# Patient Record
Sex: Female | Born: 1990 | Race: White | Hispanic: No | Marital: Single | Smoking: Current every day smoker
Health system: Southern US, Community
[De-identification: ages and names within clinical notes are randomized; demographics above are authoritative.]

## PROBLEM LIST (undated history)

## (undated) DIAGNOSIS — F419 Anxiety disorder, unspecified: Secondary | ICD-10-CM

## (undated) DIAGNOSIS — IMO0002 Reserved for concepts with insufficient information to code with codable children: Secondary | ICD-10-CM

## (undated) DIAGNOSIS — F32A Depression, unspecified: Secondary | ICD-10-CM

## (undated) DIAGNOSIS — F329 Major depressive disorder, single episode, unspecified: Secondary | ICD-10-CM

## (undated) DIAGNOSIS — F410 Panic disorder [episodic paroxysmal anxiety] without agoraphobia: Secondary | ICD-10-CM

## (undated) HISTORY — PX: WISDOM TOOTH EXTRACTION: SHX21

## (undated) HISTORY — PX: TONSILLECTOMY: SUR1361

## (undated) HISTORY — PX: KIDNEY SURGERY: SHX687

---
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
## 2009-02-04 ENCOUNTER — Emergency Department (HOSPITAL_COMMUNITY): Admission: EM | Admit: 2009-02-04 | Discharge: 2009-02-04 | Payer: Self-pay | Admitting: Emergency Medicine

## 2009-03-09 IMAGING — NM NM HEPATO W/GB/PHARM/[PERSON_NAME]
1 series · 1 of 1 positions shown · non-contrast
Comparison: Ultrasound abdomen of 10/28/2007

CLINICAL DATA: At right upper quadrant pain, nausea, vomiting

NUCLEAR MEDICINE HEPATOBILIARY IMAGING WITH GALLBLADDER EF
TECHNIQUE: Sequential images of the abdomen were obtained [DATE] minutes following intravenous administration of
radiopharmaceutical. After oral ingestion of 8oz of half and half
cream, gallbladder ejection fraction was determined.
Radiopharmaceutical:  4.7 mCi technetium 99m Choletec, 8 ounces
half and half cream orallymCi Xc-YYm Choletec

[gb hepatobiliary · 1 of 1 slices shown]
[im 1/1]
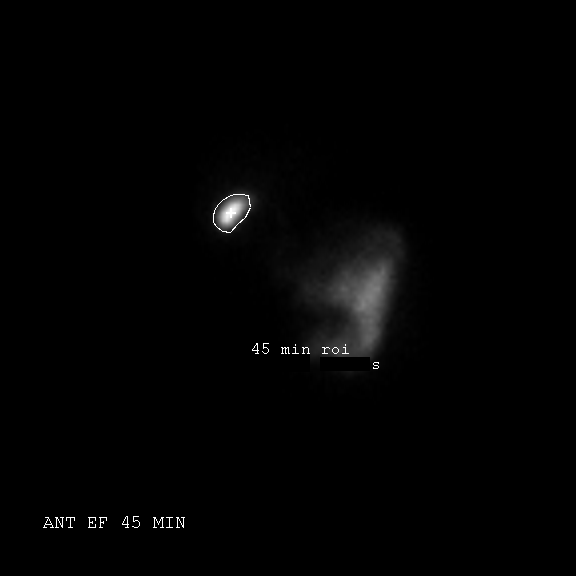

[1 of 1 positions shown; findings below may reference images not displayed]

FINDINGS: The radionuclide appears throughout the liver.  There is
excretion of the radionuclide into the intrahepatic ductal system
with visualization of the common bile duct, gallbladder, and small
bowel.  This represents a normal hepatobiliary scan.

The patient was given 8 ounces of half and half cream orally.  At
45 minutes the gallbladder ejection fraction is measured at 63%,
which is within normal limits.
IMPRESSION: 1.  Normal nuclear medicine hepatobiliary scan.
2.  Normal gallbladder ejection fraction of 63% at 45 minutes.

## 2009-04-03 ENCOUNTER — Encounter: Admission: RE | Admit: 2009-04-03 | Discharge: 2009-04-03 | Payer: Self-pay | Admitting: Family Medicine

## 2010-09-02 ENCOUNTER — Inpatient Hospital Stay (HOSPITAL_COMMUNITY)
Admission: AD | Admit: 2010-09-02 | Discharge: 2010-09-02 | Disposition: A | Discharge status: Home / Self Care | Payer: Medicaid Other | Source: Ambulatory Visit | Attending: Obstetrics and Gynecology | Admitting: Obstetrics and Gynecology

## 2010-09-02 DIAGNOSIS — N949 Unspecified condition associated with female genital organs and menstrual cycle: Secondary | ICD-10-CM | POA: Insufficient documentation

## 2010-09-02 DIAGNOSIS — N938 Other specified abnormal uterine and vaginal bleeding: Secondary | ICD-10-CM | POA: Insufficient documentation

## 2010-09-02 LAB — URINALYSIS, ROUTINE W REFLEX MICROSCOPIC
Bilirubin Urine: NEGATIVE
Glucose, UA: NEGATIVE mg/dL
Ketones, ur: NEGATIVE mg/dL
Leukocytes, UA: NEGATIVE
Nitrite: NEGATIVE
Protein, ur: NEGATIVE mg/dL
Specific Gravity, Urine: 1.015 (ref 1.005–1.030)
Urobilinogen, UA: 0.2 mg/dL (ref 0.0–1.0)
pH: 8.5 — ABNORMAL HIGH (ref 5.0–8.0)

## 2010-09-02 LAB — COMPREHENSIVE METABOLIC PANEL
ALT: 10 U/L (ref 0–35)
AST: 14 U/L (ref 0–37)
Albumin: 4 g/dL (ref 3.5–5.2)
Alkaline Phosphatase: 51 U/L (ref 39–117)
BUN: 7 mg/dL (ref 6–23)
CO2: 22 mEq/L (ref 19–32)
Calcium: 9.2 mg/dL (ref 8.4–10.5)
Chloride: 106 mEq/L (ref 96–112)
Creatinine, Ser: 0.69 mg/dL (ref 0.4–1.2)
GFR calc Af Amer: >60 mL/min (ref >60)
GFR calc non Af Amer: >60 mL/min (ref >60)
Glucose, Bld: 91 mg/dL (ref 70–99)
Potassium: 3.6 mEq/L (ref 3.5–5.1)
Sodium: 136 mEq/L (ref 135–145)
Total Bilirubin: 0.3 mg/dL (ref 0.3–1.2)
Total Protein: 7 g/dL (ref 6.0–8.3)

## 2010-09-02 LAB — POCT PREGNANCY, URINE
Preg Test, Ur: NEGATIVE
Preg Test, Ur: POSITIVE

## 2010-09-02 LAB — CBC
HCT: 37.8 % (ref 36.0–46.0)
Hemoglobin: 12.7 g/dL (ref 12.0–15.0)
MCH: 27.9 pg (ref 26.0–34.0)
MCHC: 33.6 g/dL (ref 30.0–36.0)
MCV: 82.9 fL (ref 78.0–100.0)
Platelets: 225 10*3/uL (ref 150–400)
RBC: 4.56 MIL/uL (ref 3.87–5.11)
RDW: 13.1 % (ref 11.5–15.5)
WBC: 9.6 10*3/uL (ref 4.0–10.5)

## 2010-09-02 LAB — URINE MICROSCOPIC-ADD ON

## 2010-09-17 LAB — URINALYSIS, ROUTINE W REFLEX MICROSCOPIC
Bilirubin Urine: NEGATIVE
Glucose, UA: NEGATIVE mg/dL
Ketones, ur: NEGATIVE mg/dL
Nitrite: NEGATIVE
Protein, ur: NEGATIVE mg/dL
Specific Gravity, Urine: 1.008 (ref 1.005–1.030)
Urobilinogen, UA: 0.2 mg/dL (ref 0.0–1.0)
pH: 6 (ref 5.0–8.0)

## 2010-09-17 LAB — URINE MICROSCOPIC-ADD ON

## 2010-09-17 LAB — PREGNANCY, URINE: Preg Test, Ur: NEGATIVE

## 2010-09-17 LAB — URINE CULTURE: Colony Count: 15000

## 2010-09-17 LAB — RAPID URINE DRUG SCREEN, HOSP PERFORMED
Barbiturates: NOT DETECTED
Cocaine: NOT DETECTED
Opiates: NOT DETECTED

## 2010-10-21 NOTE — Op Note (Signed)
NAMEKINLIE, JANICE                ACCOUNT NO.:  [id number]   MEDICAL RECORD NO.:  [id number]          PATIENT TYPE:  AMB   LOCATION:  SDS                          FACILITY:  [facility]   PHYSICIAN:  Jon Gills, M.D.  DATE OF BIRTH:  April 08, 1991   DATE OF PROCEDURE:  11/25/2007  DATE OF DISCHARGE:  11/25/2007                               OPERATIVE REPORT   PREOPERATIVE DIAGNOSES:  Right upper quadrant pain, nausea, and  vomiting.   POSTOPERATIVE DIAGNOSES:  Right upper quadrant pain, nausea, and  vomiting.   NAME OF OPERATION:  Upper GI endoscopy with biopsy.   SURGEON:  Jon Gills, MD   ASSISTANT:  None.   DESCRIPTION OF FINDINGS:  Following informed written consent, the  patient was taken to the operating room and placed under general  anesthesia with continuous cardiopulmonary monitoring.  She remained in  the supine position and the Pentax upper GI endoscope was passed by  mouth and advanced without difficulty.  A competent lower esophageal  sphincter was present, 37 cm from the incisors.  There was no visual  evidence for esophagitis, gastritis, duodenitis, or peptic ulcer  disease.  A solitary gastric biopsy was negative for Helicobacter by CLO  testing.  Multiple esophageal, gastric, and duodenal biopsies were  histologically normal.  The endoscope was gradually withdrawn and the  patient was awakened and taken to the recovery room in satisfactory  condition.  She will be released later today to the care of her family.   DESCRIPTION OF TECHNICAL PROCEDURE USED:  Pentax upper GI endoscope with  cold biopsy forceps.   DESCRIPTION OF SPECIMENS REMOVED:  Esophagus x3 in formalin, gastric x1  for CLO testing, gastric x3 in formalin, and duodenum x3 in formalin.           ______________________________  Jon Gills, M.D.     JHC/MEDQ  D:  12/09/2007  T:  12/09/2007  Job:  [id number]   cc:   [place] Nation, M.D.

## 2010-10-24 NOTE — Op Note (Signed)
NAMEPASCHA, FOGAL                ACCOUNT NO.:  [id number]   MEDICAL RECORD NO.:  [id number]          PATIENT TYPE:  OIB   LOCATION:  2550                         FACILITY:  [facility]   PHYSICIAN:  Kinnie Scales. Annalee Genta, M.D.DATE OF BIRTH:  24-Mar-1991   DATE OF PROCEDURE:  11/13/2005  DATE OF DISCHARGE:  11/13/2005                                 OPERATIVE REPORT   PREOPERATIVE DIAGNOSIS:  1.  Post tonsillectomy hemorrhage.  2.  Status post tonsillectomy.   POSTOPERATIVE DIAGNOSIS:  1.  Post tonsillectomy hemorrhage.  2.  Status post tonsillectomy.   SURGICAL PROCEDURE:  1.  Examination under anesthesia and control of post tonsillectomy      hemorrhage.  2.  Gastric lavage   ANESTHESIA:  General endotracheal.   SURGEON:  David L. Annalee Genta, M.D.   COMPLICATIONS:  None.   BLOOD LOSS:  Approximately 50 mL.   DISPOSITION:  The patient is transferred from the operating room to the  recovery room in stable condition.   BRIEF HISTORY:  Katelen is a 20 year old white female who underwent  tonsillectomy with Dr. Lucky Cowboy approximately two weeks prior to her  emergency presentation to [facility]. The patient's surgery was  performed for recurrent tonsillitis and chronic sore throats.  The surgery  was uneventful and the postoperative recovery was as expected without  problem or complication until the morning of November 13, 2005, when the patient  awoke with an acute post tonsillectomy hemorrhage.  She reports a coughing  and choking sensation and had a moderate amount of oropharyngeal bleeding.  She presented to the [facility] Emergency Department with active  bleeding and significant clot in the oropharynx.  Airway was stable.  Given  her history and physical findings, I recommended examination under  anesthesia with cautery and control of bleeding.  The risks, benefits, and  possible complications of the procedure were discussed in detail with the  patient's mother  and grandmother and they understood and concurred with our  plan for surgery which was scheduled on an emergency basis at [facility] OR.   PROCEDURE:  The patient was brought to the operating room at [facility] OR from the emergency department.  She underwent general  endotracheal anesthesia with rapid induction anesthesia and a stable airway  without symptoms aspiration or vomiting.  A Crowe-Davis mouth gag was  inserted.  The patient had significant clot in the oropharynx and active  bleeding from the left superior tonsillar pole.  The clots were removed and  the patient's oral cavity and oropharynx were thoroughly irrigated and  suctioned.  There was a single arterial bleeding source in the superior  aspect of the left tonsillar fossa.  This was treated with Bovie suction  cautery with excellent control.  The remainder of the tonsil fossa on the  left as well as the right appeared to be intact and healing well with  minimal eschar. Several small areas of mucosal hemorrhage were also  cauterized with suction cautery.  The patient's bleeding was under good  control.  The  Crowe-Davis mouth gag was released and reapplied again, no  active bleeding.  The oral cavity and oropharynx were suctioned.   Gastric lavage was then undertaken.  An 18-French orogastric [place] sump tube  was placed without difficulty. A moderate amount of clots and active fresh  blood were suctioned from the stomach.  The patient was then lavage with  approximately 300 mL of warm sterile saline. Lavage was continued until all  clots were cleared from the stomach.  The orogastric tube was then removed.  The Crowe-Davis mouth gag was released and removed.  There was no active  bleeding.  The patient was awakened from anesthetic, extubated, and then  transferred from the operating room to the recovery room in stable  condition.  No complications.  50 mL of blood loss.            ______________________________  Kinnie Scales Annalee Genta, M.D.     DLS/MEDQ  D:  16/03/9603  T:  11/13/2005  Job:  [id number]

## 2010-10-24 NOTE — Op Note (Signed)
NAMESENITA, CORREDOR                ACCOUNT NO.:  [id number]   MEDICAL RECORD NO.:  [id number]          PATIENT TYPE:  AMB   LOCATION:  DSC                          FACILITY:  [facility]   PHYSICIAN:  Lucky Cowboy, MD         DATE OF BIRTH:  1990/09/29   DATE OF PROCEDURE:  10/30/2005  DATE OF DISCHARGE:                                 OPERATIVE REPORT   PREOPERATIVE DIAGNOSES:  1.  Chronic tonsillitis.  2.  Adenotonsillar hypertrophy.   POSTOPERATIVE DIAGNOSES:  Dictation ended at this point.   Please delete this record.  Sera Leonie Man, MD  Electronically Signed     SJ/MEDQ  D:  10/30/2005  T:  10/31/2005  Job:  [id number]

## 2010-10-24 NOTE — Op Note (Signed)
NAMEDIKSHA, TAGLIAFERRO NO.:  [id number]   MEDICAL RECORD NO.:  [id number]          PATIENT TYPE:  AMB   LOCATION:  DSC                          FACILITY:  [facility]   PHYSICIAN:  Lucky Cowboy, MD         DATE OF BIRTH:  Jul 26, 1990   DATE OF PROCEDURE:  10/30/2005  DATE OF DISCHARGE:                                 OPERATIVE REPORT   PREOPERATIVE DIAGNOSIS:  Chronic tonsillitis with adenotonsillar  hypertrophy.   POSTOPERATIVE DIAGNOSIS:  Chronic tonsillitis with tonsillar hypertrophy.   PROCEDURE:  Tonsillectomy.   SURGEON:  Lucky Cowboy, M.D.   ANESTHESIA:  General endotracheal anesthesia.   ESTIMATED BLOOD LOSS:  20 mL.   SPECIMENS:  Tonsils.   COMPLICATIONS:  None.   INDICATIONS:  This patient is a 20 year old female who has suffered from  chronic throat pain.  There h has been no improvement despite antireflux  therapy.  She has missed 40 days of school due to the throat pain.  For  these reasons, tonsillectomy was performed.   FINDINGS:  The patient was noted to have 3+ very cryptic bilateral palatine  tonsils.   DESCRIPTION OF PROCEDURE:  The patient was taken to the operating room and  placed on the table in the supine position.  She was then placed under  general endotracheal anesthesia and the table rotated counterclockwise 90  degrees.  The neck was gently extended.  The Crowe-Davis mouth gag with a #4  tongue blade was then placed intraorally, opened and suspended on the Mayo  stand.  Inspection of the nasopharynx revealed no significant adenoid  tissue.  For this reason, adenoidectomy was not performed.  The right  palatine tonsil was grasped with Allis clamps and directed inferior  medially.  The Harmonic scalpel was then used to excise the tonsil staying  within the peritonsillar space.  The left palatine tonsil was removed in an  identical fashion.  Suction cautery used for hemostasis.  The oral cavity  was irrigated and suctioned out and an NG  tube placed down the esophagus for  suctioning of the gastric contents.  The mouth gag was removed noting no  damage to the teeth or soft tissues.  The table was rotated clockwise 90  degrees to its original position.  The patient was awakened from anesthesia  and taken to the Post Anesthesia Care Unit in stable condition.  There were  no complications.      Lucky Cowboy, MD  Electronically Signed     SJ/MEDQ  D:  10/30/2005  T:  10/31/2005  Job:  [id number]   cc:   [facility]   [place] Nation, M.D.  Fax: [phone]   Jessica Priest, M.D.  Fax: [phone]

## 2011-02-25 ENCOUNTER — Other Ambulatory Visit: Payer: Self-pay | Admitting: Orthopedic Surgery

## 2011-02-25 DIAGNOSIS — M545 Low back pain: Secondary | ICD-10-CM

## 2011-03-02 ENCOUNTER — Ambulatory Visit
Admission: RE | Admit: 2011-03-02 | Discharge: 2011-03-02 | Disposition: A | Discharge status: Home / Self Care | Payer: Medicaid Other | Source: Ambulatory Visit | Attending: Orthopedic Surgery | Admitting: Orthopedic Surgery

## 2011-03-02 DIAGNOSIS — M545 Low back pain: Secondary | ICD-10-CM

## 2011-03-02 LAB — URINALYSIS, ROUTINE W REFLEX MICROSCOPIC
Bilirubin Urine: NEGATIVE
Glucose, UA: NEGATIVE
Ketones, ur: NEGATIVE
Specific Gravity, Urine: 1.024
pH: 6

## 2011-03-02 LAB — DIFFERENTIAL
Basophils Relative: 1
Eosinophils Relative: 2
Monocytes Absolute: 0.6
Monocytes Relative: 7
Neutro Abs: 5

## 2011-03-02 LAB — POCT PREGNANCY, URINE
Operator id: 277751
Preg Test, Ur: NEGATIVE

## 2011-03-02 LAB — COMPREHENSIVE METABOLIC PANEL
AST: 16
Albumin: 3.4 — ABNORMAL LOW
Alkaline Phosphatase: 63
BUN: 6
Potassium: 3.7
Sodium: 141
Total Protein: 6.3

## 2011-03-02 LAB — CBC
HCT: 35.1 — ABNORMAL LOW
Platelets: 241
RDW: 13.1
WBC: 9.5

## 2011-03-02 LAB — URINE MICROSCOPIC-ADD ON

## 2011-03-05 LAB — POCT URINALYSIS DIP (DEVICE)
Glucose, UA: NEGATIVE
Ketones, ur: NEGATIVE
Operator id: 247071
Specific Gravity, Urine: 1.025

## 2011-03-05 LAB — CBC
HCT: 36.7
Hemoglobin: 12.8
WBC: 7.9

## 2011-03-05 LAB — AMYLASE: Amylase: 61

## 2011-03-06 LAB — URINALYSIS, ROUTINE W REFLEX MICROSCOPIC
Glucose, UA: NEGATIVE
Specific Gravity, Urine: 1.034 — ABNORMAL HIGH
pH: 6.5

## 2011-03-06 LAB — URINE MICROSCOPIC-ADD ON

## 2011-03-26 ENCOUNTER — Ambulatory Visit: Payer: Medicaid Other | Admitting: Rehabilitation

## 2011-04-09 ENCOUNTER — Ambulatory Visit: Payer: Medicaid Other | Admitting: Physical Therapy

## 2011-04-17 ENCOUNTER — Emergency Department (HOSPITAL_COMMUNITY)
Admission: EM | Admit: 2011-04-17 | Discharge: 2011-04-17 | Discharge status: Against Medical Advice | Payer: Medicaid Other

## 2012-01-15 ENCOUNTER — Encounter (HOSPITAL_COMMUNITY): Payer: Self-pay

## 2012-01-15 ENCOUNTER — Emergency Department (HOSPITAL_COMMUNITY)
Admission: EM | Admit: 2012-01-15 | Discharge: 2012-01-15 | Disposition: A | Discharge status: Home / Self Care | Payer: No Typology Code available for payment source | Attending: Emergency Medicine | Admitting: Emergency Medicine

## 2012-01-15 ENCOUNTER — Emergency Department (HOSPITAL_COMMUNITY): Payer: No Typology Code available for payment source

## 2012-01-15 DIAGNOSIS — M25519 Pain in unspecified shoulder: Secondary | ICD-10-CM | POA: Insufficient documentation

## 2012-01-15 DIAGNOSIS — Y92410 Unspecified street and highway as the place of occurrence of the external cause: Secondary | ICD-10-CM | POA: Insufficient documentation

## 2012-01-15 DIAGNOSIS — F341 Dysthymic disorder: Secondary | ICD-10-CM | POA: Insufficient documentation

## 2012-01-15 DIAGNOSIS — F17200 Nicotine dependence, unspecified, uncomplicated: Secondary | ICD-10-CM | POA: Insufficient documentation

## 2012-01-15 DIAGNOSIS — M542 Cervicalgia: Secondary | ICD-10-CM | POA: Insufficient documentation

## 2012-01-15 DIAGNOSIS — M25539 Pain in unspecified wrist: Secondary | ICD-10-CM | POA: Insufficient documentation

## 2012-01-15 DIAGNOSIS — M25531 Pain in right wrist: Secondary | ICD-10-CM

## 2012-01-15 HISTORY — DX: Reserved for concepts with insufficient information to code with codable children: IMO0002

## 2012-01-15 HISTORY — DX: Depression, unspecified: F32.A

## 2012-01-15 HISTORY — DX: Major depressive disorder, single episode, unspecified: F32.9

## 2012-01-15 HISTORY — DX: Panic disorder (episodic paroxysmal anxiety): F41.0

## 2012-01-15 HISTORY — DX: Anxiety disorder, unspecified: F41.9

## 2012-01-15 MED ORDER — OXYCODONE-ACETAMINOPHEN 5-325 MG PO TABS
1.0000 | ORAL_TABLET | Freq: Once | ORAL | Status: AC
  Administered 2012-01-15: 1 via ORAL
  Filled 2012-01-15: qty 1

## 2012-01-15 MED ORDER — IBUPROFEN 800 MG PO TABS: 800.0000 mg | ORAL_TABLET | Freq: Three times a day (TID) | ORAL | Status: AC

## 2012-01-15 NOTE — ED Notes (Signed)
C-collar was discontinued as instructed by C. Williams PAC.

## 2012-01-15 NOTE — Progress Notes (Signed)
Orthopedic Tech Progress Note Patient Details:  Wendy Reyes [birth year] [id number]  Ortho Devices Type of Ortho Device: Velcro wrist splint Ortho Device/Splint Location: right wrist  Ortho Device/Splint Interventions: Application   Wendy Reyes 01/15/2012, 6:03 PM
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
## 2012-01-15 NOTE — ED Provider Notes (Signed)
Medical screening examination/treatment/procedure(s) were performed by non-physician practitioner and as supervising physician I was immediately available for consultation/collaboration.   Richardean Canal, MD 01/15/12 [phone]

## 2012-01-15 NOTE — ED Notes (Signed)
Georgie Chard PAC is at bedside.

## 2012-01-15 NOTE — ED Notes (Signed)
Backboard removed.

## 2012-01-22 ENCOUNTER — Other Ambulatory Visit (HOSPITAL_COMMUNITY): Payer: Medicaid Other

## 2012-01-22 ENCOUNTER — Other Ambulatory Visit (HOSPITAL_COMMUNITY): Payer: Self-pay | Admitting: Chiropractic Medicine

## 2012-01-22 ENCOUNTER — Ambulatory Visit (HOSPITAL_COMMUNITY)
Admission: RE | Admit: 2012-01-22 | Discharge: 2012-01-22 | Disposition: A | Discharge status: Home / Self Care | Payer: No Typology Code available for payment source | Source: Ambulatory Visit | Attending: Chiropractic Medicine | Admitting: Chiropractic Medicine

## 2012-01-22 DIAGNOSIS — R51 Headache: Secondary | ICD-10-CM

## 2012-01-22 DIAGNOSIS — M545 Low back pain, unspecified: Secondary | ICD-10-CM | POA: Insufficient documentation

## 2012-02-04 ENCOUNTER — Other Ambulatory Visit (HOSPITAL_COMMUNITY): Payer: Self-pay | Admitting: Chiropractic Medicine

## 2012-02-04 DIAGNOSIS — R52 Pain, unspecified: Secondary | ICD-10-CM

## 2012-02-06 ENCOUNTER — Ambulatory Visit (HOSPITAL_COMMUNITY)
Admission: RE | Admit: 2012-02-06 | Discharge: 2012-02-06 | Disposition: A | Discharge status: Home / Self Care | Payer: No Typology Code available for payment source | Source: Ambulatory Visit | Attending: Chiropractic Medicine | Admitting: Chiropractic Medicine

## 2012-02-06 DIAGNOSIS — R52 Pain, unspecified: Secondary | ICD-10-CM

## 2012-02-06 DIAGNOSIS — M25439 Effusion, unspecified wrist: Secondary | ICD-10-CM | POA: Insufficient documentation

## 2012-02-06 DIAGNOSIS — M25539 Pain in unspecified wrist: Secondary | ICD-10-CM | POA: Insufficient documentation

## 2012-02-06 DIAGNOSIS — S60219A Contusion of unspecified wrist, initial encounter: Secondary | ICD-10-CM | POA: Insufficient documentation

## 2012-10-16 ENCOUNTER — Encounter (HOSPITAL_COMMUNITY): Payer: Self-pay | Admitting: *Deleted

## 2012-10-16 ENCOUNTER — Emergency Department (HOSPITAL_COMMUNITY)
Admission: EM | Admit: 2012-10-16 | Discharge: 2012-10-16 | Disposition: A | Discharge status: Home / Self Care | Payer: Self-pay | Attending: Emergency Medicine | Admitting: Emergency Medicine

## 2012-10-16 DIAGNOSIS — F329 Major depressive disorder, single episode, unspecified: Secondary | ICD-10-CM | POA: Insufficient documentation

## 2012-10-16 DIAGNOSIS — Z8739 Personal history of other diseases of the musculoskeletal system and connective tissue: Secondary | ICD-10-CM | POA: Insufficient documentation

## 2012-10-16 DIAGNOSIS — F411 Generalized anxiety disorder: Secondary | ICD-10-CM | POA: Insufficient documentation

## 2012-10-16 DIAGNOSIS — N390 Urinary tract infection, site not specified: Secondary | ICD-10-CM | POA: Insufficient documentation

## 2012-10-16 DIAGNOSIS — Z9889 Other specified postprocedural states: Secondary | ICD-10-CM | POA: Insufficient documentation

## 2012-10-16 DIAGNOSIS — R109 Unspecified abdominal pain: Secondary | ICD-10-CM | POA: Insufficient documentation

## 2012-10-16 DIAGNOSIS — F3289 Other specified depressive episodes: Secondary | ICD-10-CM | POA: Insufficient documentation

## 2012-10-16 DIAGNOSIS — Z79899 Other long term (current) drug therapy: Secondary | ICD-10-CM | POA: Insufficient documentation

## 2012-10-16 DIAGNOSIS — F17200 Nicotine dependence, unspecified, uncomplicated: Secondary | ICD-10-CM | POA: Insufficient documentation

## 2012-10-16 DIAGNOSIS — Z3202 Encounter for pregnancy test, result negative: Secondary | ICD-10-CM | POA: Insufficient documentation

## 2012-10-16 DIAGNOSIS — R51 Headache: Secondary | ICD-10-CM | POA: Insufficient documentation

## 2012-10-16 LAB — URINALYSIS, ROUTINE W REFLEX MICROSCOPIC
Glucose, UA: NEGATIVE mg/dL
Hgb urine dipstick: NEGATIVE
Ketones, ur: 40 mg/dL — AB
Nitrite: POSITIVE — AB
Protein, ur: 30 mg/dL — AB
Specific Gravity, Urine: 1.027 (ref 1.005–1.030)
Urobilinogen, UA: 2 mg/dL — ABNORMAL HIGH (ref 0.0–1.0)
pH: 7.5 (ref 5.0–8.0)

## 2012-10-16 LAB — URINE MICROSCOPIC-ADD ON

## 2012-10-16 LAB — POCT PREGNANCY, URINE: Preg Test, Ur: NEGATIVE

## 2012-10-16 MED ORDER — KETOROLAC TROMETHAMINE 15 MG/ML IJ SOLN
15.0000 mg | Freq: Once | INTRAMUSCULAR | Status: AC
  Filled 2012-10-16: qty 1

## 2012-10-16 MED ORDER — KETOROLAC TROMETHAMINE 30 MG/ML IJ SOLN
INTRAMUSCULAR | Status: AC
  Administered 2012-10-16: 15 mg
  Filled 2012-10-16: qty 1

## 2012-10-16 MED ORDER — SODIUM CHLORIDE 0.9 % IV BOLUS (SEPSIS)
1000.0000 mL | Freq: Once | INTRAVENOUS | Status: AC
  Administered 2012-10-16: 1000 mL via INTRAVENOUS

## 2012-10-16 MED ORDER — CIPROFLOXACIN IN D5W 400 MG/200ML IV SOLN
400.0000 mg | Freq: Once | INTRAVENOUS | Status: AC
  Administered 2012-10-16: 400 mg via INTRAVENOUS
  Filled 2012-10-16: qty 200

## 2012-10-16 MED ORDER — CIPROFLOXACIN HCL 500 MG PO TABS: 500.0000 mg | ORAL_TABLET | Freq: Two times a day (BID) | ORAL | Status: DC

## 2012-10-16 NOTE — ED Notes (Signed)
Pt is here with painful urination, frequency with urination, and pt has some lower abdominal pain.  Pt reports vaginal pain and back pain.  Pt concerned about a sinus infection too.

## 2012-10-18 LAB — URINE CULTURE

## 2012-10-20 NOTE — ED Provider Notes (Signed)
History    22 year old female with dysuria. Onset about 3 days ago. Increased urinary frequency. Some crampy lower abdominal pain. Suprapubic. No radiation. No fevers or chills. No nausea or vomiting. Patient is M. concerned about possibly having a sinus infection. She endorses increased facial pressure and a mild frontal headache over the past day or 2. No intervention prior to arrival.  CSN: [id number]  Arrival date & time 10/16/12  1437   First MD Initiated Contact with Patient 10/16/12 1637      No chief complaint on file.   (Consider location/radiation/quality/duration/timing/severity/associated sxs/prior treatment) HPI  Past Medical History  Diagnosis Date  . DDD (degenerative disc disease)   . Anxiety   . Panic attacks   . Depression     Past Surgical History  Procedure Laterality Date  . Tonsillectomy    . Kidney surgery    . Wisdom tooth extraction      No family history on file.  History  Substance Use Topics  . Smoking status: Current Every Day Smoker -- 1.00 packs/day  . Smokeless tobacco: Not on file  . Alcohol Use: No    OB History   Grav Para Term Preterm Abortions TAB SAB Ect Mult Living                  Review of Systems  All systems reviewed and negative, other than as noted in HPI.   Allergies  Sulfa antibiotics and Amoxicillin  Home Medications   Current Outpatient Rx  Name  Route  Sig  Dispense  Refill  . diazepam (VALIUM) 10 MG tablet   Oral   Take 10 mg by mouth 2 (two) times daily.         Marland Kitchen doxepin (SINEQUAN) 50 MG capsule   Oral   Take 100 mg by mouth at bedtime.         . lamoTRIgine (LAMICTAL) 100 MG tablet   Oral   Take 100 mg by mouth every morning.         . Pseudoephedrine-APAP-DM (TYLENOL COLD/FLU SEVERE DAY PO)   Oral   Take 10 mLs by mouth 2 (two) times daily as needed (for cold/flu symptoms).          . ciprofloxacin (CIPRO) 500 MG tablet   Oral   Take 1 tablet (500 mg total) by mouth 2 (two) times  daily.   8 tablet   0     BP 109/67  Pulse 58  Temp(Src) 98.7 F (37.1 C) (Oral)  Resp 18  SpO2 99%  LMP 10/03/2012  Physical Exam  Nursing note and vitals reviewed. Constitutional: She appears well-developed and well-nourished. No distress.  HENT:  Head: Normocephalic and atraumatic.  Eyes: Conjunctivae are normal. Right eye exhibits no discharge. Left eye exhibits no discharge.  Neck: Neck supple.  Cardiovascular: Normal rate, regular rhythm and normal heart sounds.  Exam reveals no gallop and no friction rub.   No murmur heard. Pulmonary/Chest: Effort normal and breath sounds normal. No respiratory distress.  Abdominal: Soft. She exhibits no distension. There is no tenderness.  Mild suprapubic tenderness without rebound or guarding. No distention.  Genitourinary:  No CVA tenderness  Musculoskeletal: She exhibits no edema and no tenderness.  Neurological: She is alert.  Skin: Skin is warm and dry.  Psychiatric: She has a normal mood and affect. Her behavior is normal. Thought content normal.    ED Course  Procedures (including critical care time)  Labs Reviewed  URINALYSIS, ROUTINE W REFLEX  MICROSCOPIC - Abnormal; Notable for the following:    APPearance CLOUDY (*)    Bilirubin Urine SMALL (*)    Ketones, ur 40 (*)    Protein, ur 30 (*)    Urobilinogen, UA 2.0 (*)    Nitrite POSITIVE (*)    Leukocytes, UA MODERATE (*)    All other components within normal limits  URINE MICROSCOPIC-ADD ON - Abnormal; Notable for the following:    Squamous Epithelial / LPF FEW (*)    Bacteria, UA FEW (*)    All other components within normal limits  URINE CULTURE  POCT PREGNANCY, URINE   No results found.   1. UTI (urinary tract infection)       MDM  21yF with symptoms of cystitis and UA consistent with as well. Afebrile and well appearing. outpt tx. Return precautions discussed.         Raeford Razor, MD 10/20/12 [phone]

## 2012-10-22 ENCOUNTER — Telehealth (HOSPITAL_COMMUNITY): Payer: Self-pay | Admitting: Emergency Medicine

## 2012-10-22 NOTE — ED Notes (Signed)
Post ED Visit - Positive Culture Follow-up  Culture report reviewed by antimicrobial stewardship pharmacist: []  Wes Dulaney, Pharm.D., BCPS [x]  Celedonio Miyamoto, Pharm.D., BCPS []  Georgina Pillion, Pharm.D., BCPS []  [place], [address].D., BCPS, AAHIVP []  Estella Husk, Pharm.D., BCPS, AAHIV  Positive urine culture Treated with Cipro, organism sensitive to the same and no further patient follow-up is required at this time.  Kylie A Holland 10/22/2012, 8:41 AM

## 2012-10-26 ENCOUNTER — Encounter (HOSPITAL_COMMUNITY): Payer: Self-pay | Admitting: Adult Health

## 2012-10-26 DIAGNOSIS — Z87440 Personal history of urinary (tract) infections: Secondary | ICD-10-CM | POA: Insufficient documentation

## 2012-10-26 DIAGNOSIS — Z79899 Other long term (current) drug therapy: Secondary | ICD-10-CM | POA: Insufficient documentation

## 2012-10-26 DIAGNOSIS — F17200 Nicotine dependence, unspecified, uncomplicated: Secondary | ICD-10-CM | POA: Insufficient documentation

## 2012-10-26 DIAGNOSIS — F411 Generalized anxiety disorder: Secondary | ICD-10-CM | POA: Insufficient documentation

## 2012-10-26 DIAGNOSIS — F329 Major depressive disorder, single episode, unspecified: Secondary | ICD-10-CM | POA: Insufficient documentation

## 2012-10-26 DIAGNOSIS — N898 Other specified noninflammatory disorders of vagina: Secondary | ICD-10-CM | POA: Insufficient documentation

## 2012-10-26 DIAGNOSIS — Z8739 Personal history of other diseases of the musculoskeletal system and connective tissue: Secondary | ICD-10-CM | POA: Insufficient documentation

## 2012-10-26 DIAGNOSIS — Z3202 Encounter for pregnancy test, result negative: Secondary | ICD-10-CM | POA: Insufficient documentation

## 2012-10-26 DIAGNOSIS — R1031 Right lower quadrant pain: Secondary | ICD-10-CM | POA: Insufficient documentation

## 2012-10-26 DIAGNOSIS — F3289 Other specified depressive episodes: Secondary | ICD-10-CM | POA: Insufficient documentation

## 2012-10-26 LAB — URINALYSIS, ROUTINE W REFLEX MICROSCOPIC
Hgb urine dipstick: NEGATIVE
Leukocytes, UA: NEGATIVE
Specific Gravity, Urine: 1.035 — ABNORMAL HIGH (ref 1.005–1.030)
Urobilinogen, UA: 1 mg/dL (ref 0.0–1.0)

## 2012-10-26 LAB — POCT PREGNANCY, URINE: Preg Test, Ur: NEGATIVE

## 2012-10-26 NOTE — ED Notes (Signed)
Presents with lower abdominal/vaginal pain described as "intense pain" Treated on mothers day for a kidney infection and given cipro. The pain has gotten worse and pt is unable to stand for long periods due to pain. Reports pain with urination.

## 2012-10-27 ENCOUNTER — Emergency Department (HOSPITAL_COMMUNITY)
Admission: EM | Admit: 2012-10-27 | Discharge: 2012-10-27 | Disposition: A | Discharge status: Home / Self Care | Payer: Self-pay | Attending: Emergency Medicine | Admitting: Emergency Medicine

## 2012-10-27 ENCOUNTER — Emergency Department (HOSPITAL_COMMUNITY): Payer: Self-pay

## 2012-10-27 DIAGNOSIS — R109 Unspecified abdominal pain: Secondary | ICD-10-CM

## 2012-10-27 LAB — CBC WITH DIFFERENTIAL/PLATELET
Eosinophils Relative: 1 % (ref 0–5)
HCT: 37.7 % (ref 36.0–46.0)
Lymphocytes Relative: 30 % (ref 12–46)
Lymphs Abs: 2.8 10*3/uL (ref 0.7–4.0)
MCV: 82.5 fL (ref 78.0–100.0)
Monocytes Absolute: 0.6 10*3/uL (ref 0.1–1.0)
Neutro Abs: 5.8 10*3/uL (ref 1.7–7.7)
RBC: 4.57 MIL/uL (ref 3.87–5.11)
WBC: 9.4 10*3/uL (ref 4.0–10.5)

## 2012-10-27 MED ORDER — HYDROCODONE-ACETAMINOPHEN 5-325 MG PO TABS: 2.0000 | ORAL_TABLET | ORAL | Status: DC | PRN

## 2012-10-27 NOTE — ED Notes (Signed)
Pelvic cart setup for exam 

## 2012-10-27 NOTE — ED Provider Notes (Signed)
History     CSN: [id number]  Arrival date & time 10/26/12  2057   First MD Initiated Contact with Patient 10/27/12 0123      Chief Complaint  Patient presents with  . Abdominal Pain    (Consider location/radiation/quality/duration/timing/severity/associated sxs/prior treatment) HPI Comments: Patient presents with right lower abdominal pain for the past three weeks.  She was found to have a uti and treated with antibiotics but is continuing to have the same pain.  No fevers or chills.  No vaginal discharge.  Her last period was last month and normal.  Denies possibility of pregnancy.    Patient is a 22 y.o. female presenting with abdominal pain. The history is provided by the patient.  Abdominal Pain This is a new problem. Episode onset: 3 weeks ago. The problem occurs constantly. The problem has been gradually worsening. Associated symptoms include abdominal pain. Nothing aggravates the symptoms. Nothing relieves the symptoms. She has tried nothing for the symptoms. The treatment provided no relief.    Past Medical History  Diagnosis Date  . DDD (degenerative disc disease)   . Anxiety   . Panic attacks   . Depression     Past Surgical History  Procedure Laterality Date  . Tonsillectomy    . Kidney surgery    . Wisdom tooth extraction      History reviewed. No pertinent family history.  History  Substance Use Topics  . Smoking status: Current Every Day Smoker -- 1.00 packs/day  . Smokeless tobacco: Not on file  . Alcohol Use: No    OB History   Grav Para Term Preterm Abortions TAB SAB Ect Mult Living                  Review of Systems  Gastrointestinal: Positive for abdominal pain.  All other systems reviewed and are negative.    Allergies  Sulfa antibiotics and Amoxicillin  Home Medications   Current Outpatient Rx  Name  Route  Sig  Dispense  Refill  . diazepam (VALIUM) 10 MG tablet   Oral   Take 10 mg by mouth every 12 (twelve) hours as needed for  anxiety.          Marland Kitchen doxepin (SINEQUAN) 50 MG capsule   Oral   Take 100 mg by mouth at bedtime.         . lamoTRIgine (LAMICTAL) 100 MG tablet   Oral   Take 100 mg by mouth every morning.           BP 112/78  Pulse 88  Temp(Src) 98.6 F (37 C) (Oral)  Resp 18  SpO2 99%  LMP 10/03/2012  Physical Exam  Nursing note and vitals reviewed. Constitutional: She is oriented to person, place, and time. She appears well-developed and well-nourished. No distress.  HENT:  Head: Normocephalic and atraumatic.  Neck: Normal range of motion. Neck supple.  Cardiovascular: Normal rate and regular rhythm.  Exam reveals no gallop and no friction rub.   No murmur heard. Pulmonary/Chest: Effort normal and breath sounds normal. No respiratory distress. She has no wheezes.  Abdominal: Soft. Bowel sounds are normal. She exhibits no distension. There is tenderness.  There is ttp in the suprapubic region and right lower quadrant.    Genitourinary: Uterus normal. Vaginal discharge found.  There is a slight whitish discharge present.  There is ttp in the right adnexa, but I am unable to palpate a mass.  No cmt.  Musculoskeletal: Normal range of motion.  Neurological:  She is alert and oriented to person, place, and time.  Skin: Skin is warm and dry. She is not diaphoretic.    ED Course  Procedures (including critical care time)  Labs Reviewed  URINALYSIS, ROUTINE W REFLEX MICROSCOPIC - Abnormal; Notable for the following:    APPearance HAZY (*)    Specific Gravity, Urine 1.035 (*)    Bilirubin Urine SMALL (*)    All other components within normal limits  WET PREP, GENITAL  GC/CHLAMYDIA PROBE AMP  CBC WITH DIFFERENTIAL  POCT PREGNANCY, URINE   No results found.   No diagnosis found.    MDM  The workup does not suggest uti, ovarian cyst, ectopic pregnancy, appendicitis, vaginal infection, or other emergent cause.  She will be discharged with pain meds, to discuss further workup with  pcp if not improving in the next few days.          Geoffery Lyons, MD 10/27/12 [phone]

## 2012-10-27 NOTE — ED Notes (Signed)
Lab in room to obtain blood work

## 2012-10-27 NOTE — ED Notes (Signed)
Assisted Dr Wendy Reyes in pelvic exam. Patient tolerated well.

## 2012-10-27 NOTE — ED Notes (Signed)
Patient given discharge instructions for abdominal pain. rx for norco. Advised to follow up with primary care or to return to this department if condition worsens. Patient voiced understanding of all instructions and had no further questions. Patient ambulated to front lobby without difficulty.

## 2012-10-28 LAB — GC/CHLAMYDIA PROBE AMP: GC Probe RNA: NEGATIVE
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
## 2013-08-08 ENCOUNTER — Encounter (HOSPITAL_COMMUNITY): Payer: Self-pay | Admitting: Emergency Medicine

## 2013-08-08 ENCOUNTER — Emergency Department (HOSPITAL_COMMUNITY)
Admission: EM | Admit: 2013-08-08 | Discharge: 2013-08-08 | Disposition: A | Discharge status: Home / Self Care | Payer: No Typology Code available for payment source | Attending: Emergency Medicine | Admitting: Emergency Medicine

## 2013-08-08 DIAGNOSIS — F17200 Nicotine dependence, unspecified, uncomplicated: Secondary | ICD-10-CM | POA: Insufficient documentation

## 2013-08-08 DIAGNOSIS — K089 Disorder of teeth and supporting structures, unspecified: Secondary | ICD-10-CM | POA: Insufficient documentation

## 2013-08-08 DIAGNOSIS — Z8659 Personal history of other mental and behavioral disorders: Secondary | ICD-10-CM | POA: Insufficient documentation

## 2013-08-08 DIAGNOSIS — Z8739 Personal history of other diseases of the musculoskeletal system and connective tissue: Secondary | ICD-10-CM | POA: Insufficient documentation

## 2013-08-08 DIAGNOSIS — K0889 Other specified disorders of teeth and supporting structures: Secondary | ICD-10-CM

## 2013-08-08 MED ORDER — HYDROCODONE-ACETAMINOPHEN 5-325 MG PO TABS: 1.0000 | ORAL_TABLET | ORAL | Status: DC | PRN

## 2013-08-08 MED ORDER — HYDROCODONE-ACETAMINOPHEN 5-325 MG PO TABS
1.0000 | ORAL_TABLET | Freq: Once | ORAL | Status: AC
  Administered 2013-08-08: 1 via ORAL
  Filled 2013-08-08: qty 1

## 2013-08-08 MED ORDER — CLINDAMYCIN HCL 300 MG PO CAPS
300.0000 mg | ORAL_CAPSULE | Freq: Once | ORAL | Status: AC
  Administered 2013-08-08: 300 mg via ORAL
  Filled 2013-08-08: qty 1

## 2013-08-08 MED ORDER — CLINDAMYCIN HCL 150 MG PO CAPS: 300.0000 mg | ORAL_CAPSULE | Freq: Three times a day (TID) | ORAL | Status: AC

## 2013-08-08 NOTE — Progress Notes (Signed)
[facility] provided pt with a list of primary care resources to help patient establish primary care, as well as, dental resources.  °

## 2013-08-08 NOTE — ED Provider Notes (Signed)
CSN: [id number]     Arrival date & time 08/08/13  1508 History   None    Chief Complaint  Patient presents with  . Dental Pain     (Consider location/radiation/quality/duration/timing/severity/associated sxs/prior Treatment) Patient is a 23 y.o. female presenting with tooth pain. The history is provided by the patient. No language interpreter was used.  Dental Pain Location:  Lower Lower teeth location:  18/LL 2nd molar Quality:  Shooting and throbbing Associated symptoms: no facial swelling and no fever   Associated symptoms comment:  Lower left molar that fractured over one month ago now causing significant pain without facial swelling or fever.    Past Medical History  Diagnosis Date  . DDD (degenerative disc disease)   . Anxiety   . Panic attacks   . Depression    Past Surgical History  Procedure Laterality Date  . Tonsillectomy    . Kidney surgery    . Wisdom tooth extraction     History reviewed. No pertinent family history. History  Substance Use Topics  . Smoking status: Current Every Day Smoker -- 1.00 packs/day  . Smokeless tobacco: Not on file  . Alcohol Use: No   OB History   Grav Para Term Preterm Abortions TAB SAB Ect Mult Living                 Review of Systems  Constitutional: Negative for fever and chills.  HENT: Positive for dental problem. Negative for facial swelling and trouble swallowing.   Respiratory: Negative.   Cardiovascular: Negative.   Gastrointestinal: Negative.   Musculoskeletal: Negative.   Skin: Negative.   Neurological: Negative.       Allergies  Sulfa antibiotics and Amoxicillin  Home Medications  No current outpatient prescriptions on file. BP 119/72  Pulse 88  Temp(Src) 98.6 F (37 C) (Oral)  Resp 16  SpO2 100% Physical Exam  Constitutional: She is oriented to person, place, and time. She appears well-developed and well-nourished.  HENT:  Mouth/Throat: Oropharynx is clear and moist.  Generally good dentition.  #18 with missing fragment laterally with exposed filling. No surrounding swelling.   Neck: Normal range of motion.  Pulmonary/Chest: Effort normal.  Lymphadenopathy:       Head (left side): No submental adenopathy present.  Neurological: She is alert and oriented to person, place, and time.  Skin: Skin is warm and dry.    ED Course  Procedures (including critical care time) Labs Review Labs Reviewed - No data to display Imaging Review No results found.   EKG Interpretation None      MDM   Final diagnoses:  None    1. Dental pain  No evidence of drainable abscess. Will cover with abx and encourage dental follow up.    Arnoldo HookerShari A Gyneth Hubka, PA-C 08/08/13 1642

## 2013-08-08 NOTE — Discharge Instructions (Signed)
Dental Care and Dentist Visits °Dental care supports good overall health. Regular dental visits can also help you avoid dental pain, bleeding, infection, and other more serious health problems in the future. It is important to keep the mouth healthy because diseases in the teeth, gums, and other oral tissues can spread to other areas of the body. Some problems, such as diabetes, heart disease, and pre-term labor have been associated with poor oral health.  °See your dentist every 6 months. If you experience emergency problems such as a toothache or broken tooth, go to the dentist right away. If you see your dentist regularly, you may catch problems early. It is easier to be treated for problems in the early stages.  °WHAT TO EXPECT AT A DENTIST VISIT  °Your dentist will look for many common oral health problems and recommend proper treatment. At your regular dental visit, you can expect: °· Gentle cleaning of the teeth and gums. This includes scraping and polishing. This helps to remove the sticky substance around the teeth and gums (plaque). Plaque forms in the mouth shortly after eating. Over time, plaque hardens on the teeth as tartar. If tartar is not removed regularly, it can cause problems. Cleaning also helps remove stains. °· Periodic X-rays. These pictures of the teeth and supporting bone will help your dentist assess the health of your teeth. °· Periodic fluoride treatments. Fluoride is a natural mineral shown to help strengthen teeth. Fluoride treatment involves applying a fluoride gel or varnish to the teeth. It is most commonly done in children. °· Examination of the mouth, tongue, jaws, teeth, and gums to look for any oral health problems, such as: °· Cavities (dental caries). This is decay on the tooth caused by plaque, sugar, and acid in the mouth. It is best to catch a cavity when it is small. °· Inflammation of the gums caused by plaque buildup (gingivitis). °· Problems with the mouth or malformed  or misaligned teeth. °· Oral cancer or other diseases of the soft tissues or jaws.  °KEEP YOUR TEETH AND GUMS HEALTHY °For healthy teeth and gums, follow these general guidelines as well as your dentist's specific advice: °· Have your teeth professionally cleaned at the dentist every 6 months. °· Brush twice daily with a fluoride toothpaste. °· Floss your teeth daily.  °· Ask your dentist if you need fluoride supplements, treatments, or fluoride toothpaste. °· Eat a healthy diet. Reduce foods and drinks with added sugar. °· Avoid smoking. °TREATMENT FOR ORAL HEALTH PROBLEMS °If you have oral health problems, treatment varies depending on the conditions present in your teeth and gums. °· Your caregiver will most likely recommend good oral hygiene at each visit. °· For cavities, gingivitis, or other oral health disease, your caregiver will perform a procedure to treat the problem. This is typically done at a separate appointment. Sometimes your caregiver will refer you to another dental specialist for specific tooth problems or for surgery. °SEEK IMMEDIATE DENTAL CARE IF: °· You have pain, bleeding, or soreness in the gum, tooth, jaw, or mouth area. °· A permanent tooth becomes loose or separated from the gum socket. °· You experience a blow or injury to the mouth or jaw area. °Document Released: 02/04/2011 Document Revised: 08/17/2011 Document Reviewed: 02/04/2011 °[employer]® Patient Information ©2014 [employer]. ° °Dental Pain °A tooth ache may be caused by cavities (tooth decay). Cavities expose the nerve of the tooth to air and hot or cold temperatures. It may come from an infection or abscess (also called a   boil or furuncle) around your tooth. It is also often caused by dental caries (tooth decay). This causes the pain you are having. °DIAGNOSIS  °Your caregiver can diagnose this problem by exam. °TREATMENT  °· If caused by an infection, it may be treated with medications which kill germs (antibiotics) and pain  medications as prescribed by your caregiver. Take medications as directed. °· Only take over-the-counter or prescription medicines for pain, discomfort, or fever as directed by your caregiver. °· Whether the tooth ache today is caused by infection or dental disease, you should see your dentist as soon as possible for further care. °SEEK MEDICAL CARE IF: °The exam and treatment you received today has been provided on an emergency basis only. This is not a substitute for complete medical or dental care. If your problem worsens or new problems (symptoms) appear, and you are unable to meet with your dentist, call or return to this location. °SEEK IMMEDIATE MEDICAL CARE IF:  °· You have a fever. °· You develop redness and swelling of your face, jaw, or neck. °· You are unable to open your mouth. °· You have severe pain uncontrolled by pain medicine. °MAKE SURE YOU:  °· Understand these instructions. °· Will watch your condition. °· Will get help right away if you are not doing well or get worse. °Document Released: 05/25/2005 Document Revised: 08/17/2011 Document Reviewed: 01/11/2008 °[employer]® Patient Information ©2014 [employer]. ° °

## 2013-08-08 NOTE — ED Notes (Signed)
Pt states L lower back molar cap fell out one month ago. Pt began to have increased pain, foul odor to breath, bad mouth taste and blood with brushing teeth. Pt has eroded back lower L molar.

## 2013-08-13 NOTE — ED Provider Notes (Signed)
Medical screening examination/treatment/procedure(s) were performed by non-physician practitioner and as supervising physician I was immediately available for consultation/collaboration.  Afia Messenger T Joeseph Verville, MD 08/13/13 1602 

## 2014-02-20 IMAGING — US US TRANSVAGINAL NON-OB
1 series · 14 of 25 positions shown · non-contrast
Comparison: None.

CLINICAL DATA: Pelvic pain.

TRANSABDOMINAL AND TRANSVAGINAL ULTRASOUND OF PELVIS
DOPPLER ULTRASOUND OF OVARIES
TECHNIQUE: Both transabdominal and transvaginal ultrasound
examinations of the pelvis were performed. Transabdominal technique
was performed for global imaging of the pelvis including uterus,
ovaries, adnexal regions, and pelvic cul-de-sac.
It was necessary to proceed with endovaginal exam following the
transabdominal exam to visualize the ovaries and endometrium.
Color and duplex Doppler ultrasound was utilized to evaluate blood
flow to the ovaries.

[Series 1: us transvaginal non-ob · 0.21mm/px · 76 acquisitions, 14 frames shown]
[im 1/76]
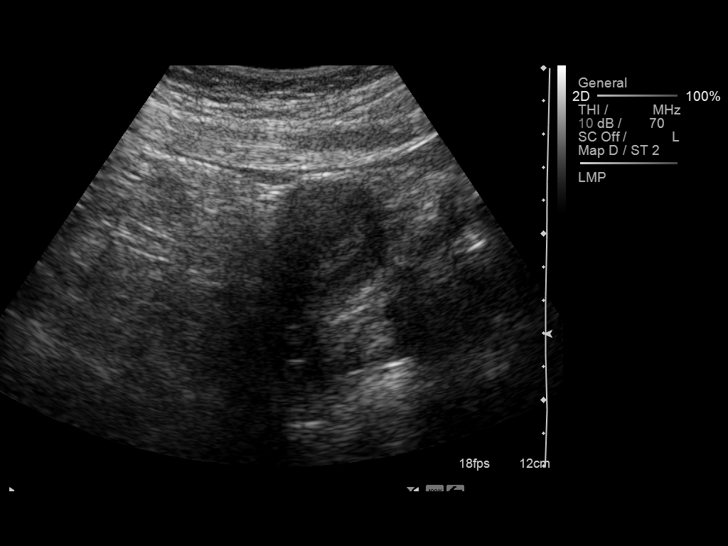
[im 7/76]
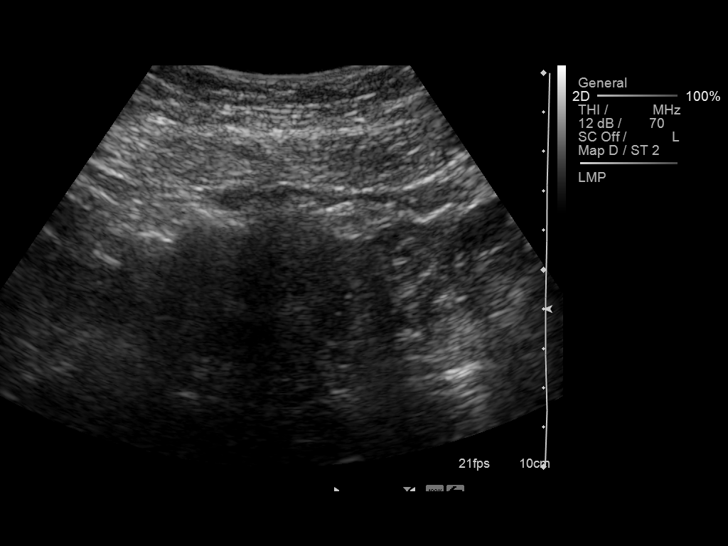
[im 13/76]
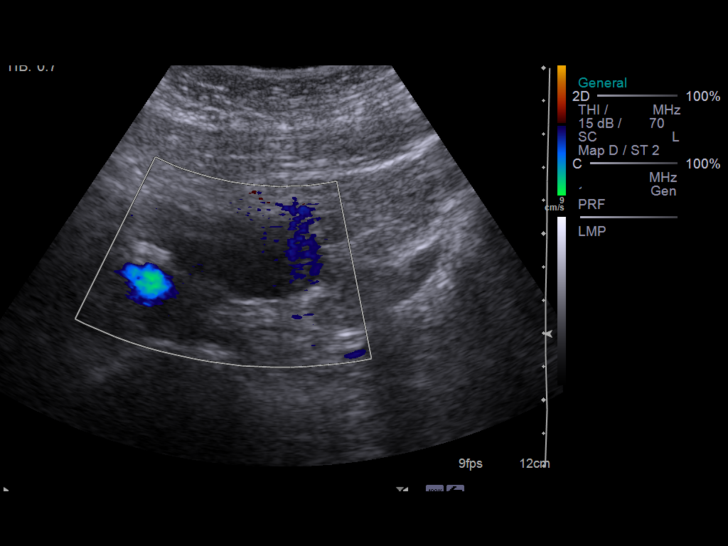
[im 19/76]
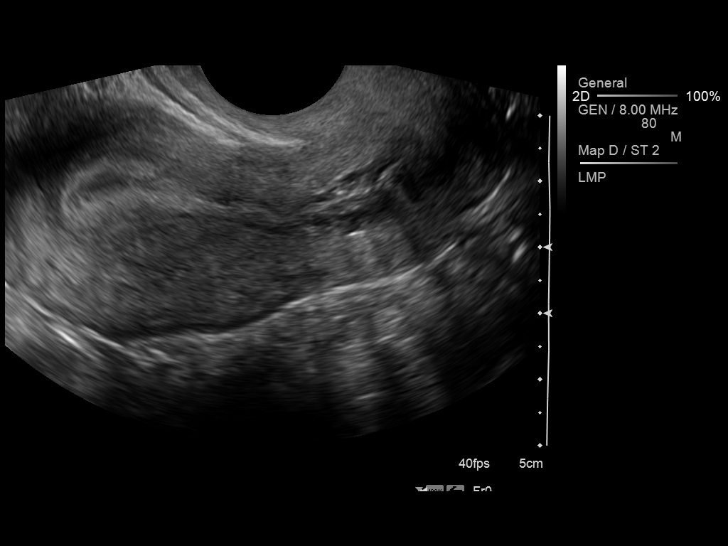
[im 26/76]
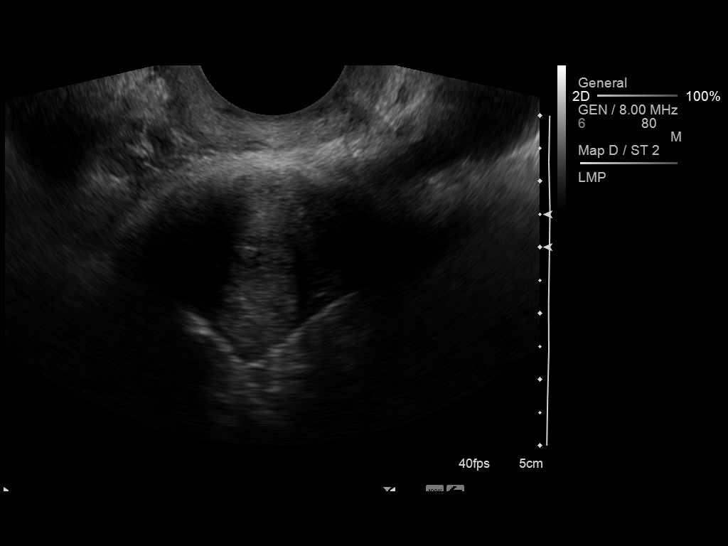
[im 29/76]
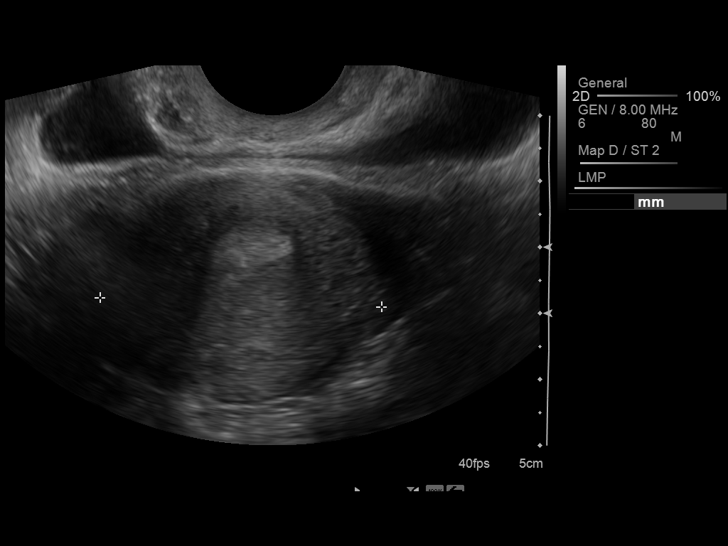
[im 35/76]
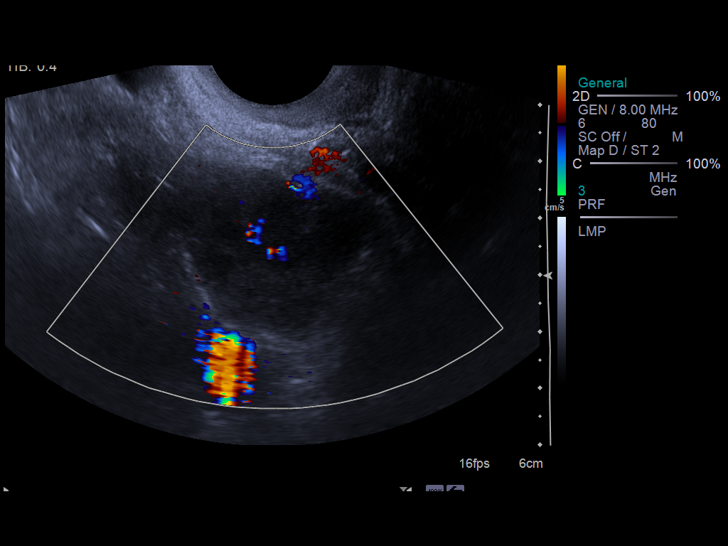
[im 41/76]
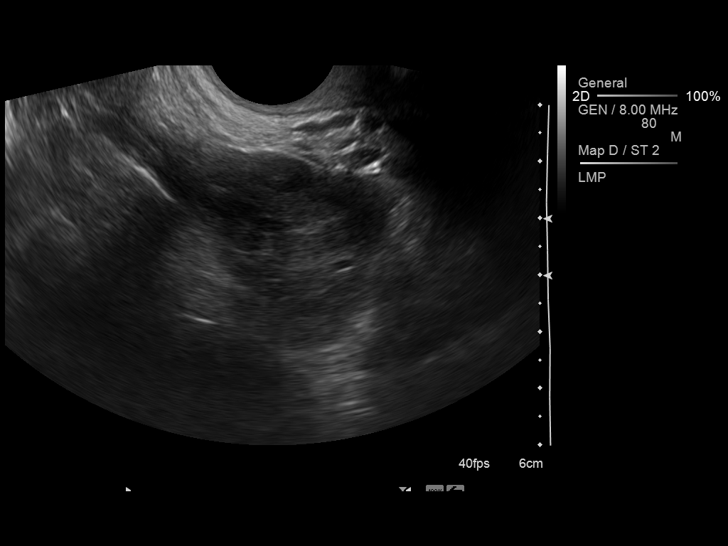
[im 47/76]
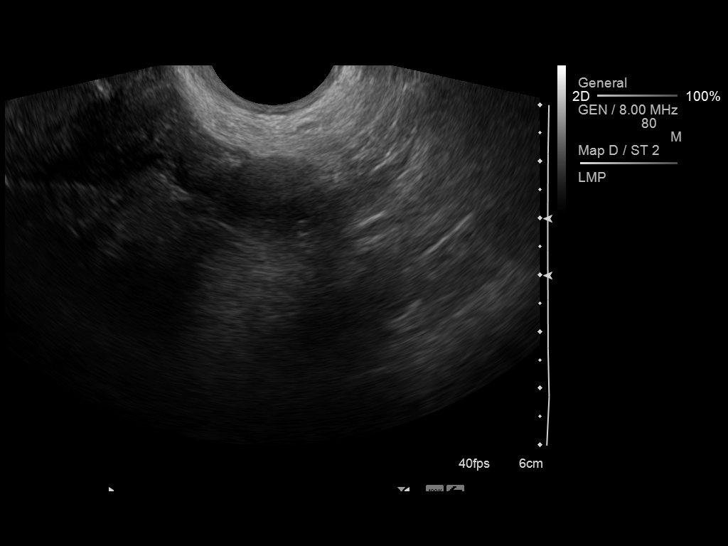
[im 51/76]
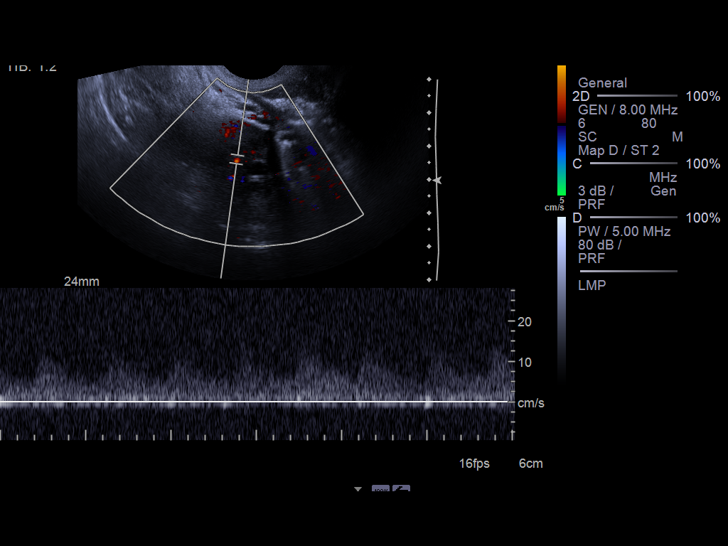
[im 57/76]
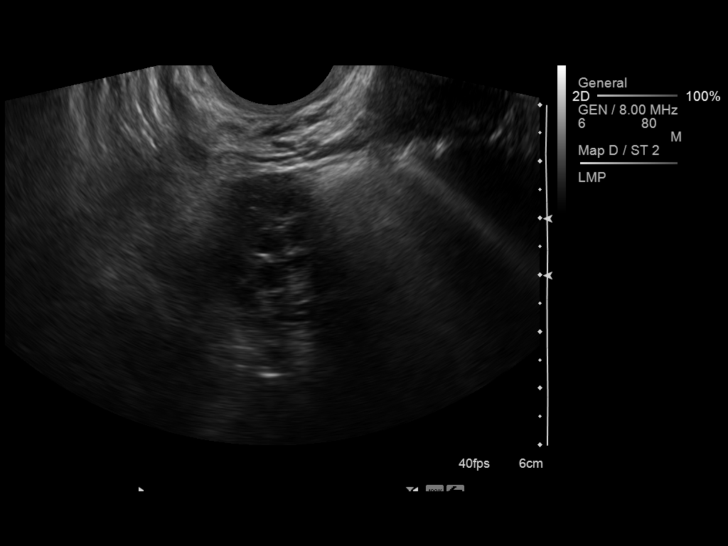
[im 63/76]
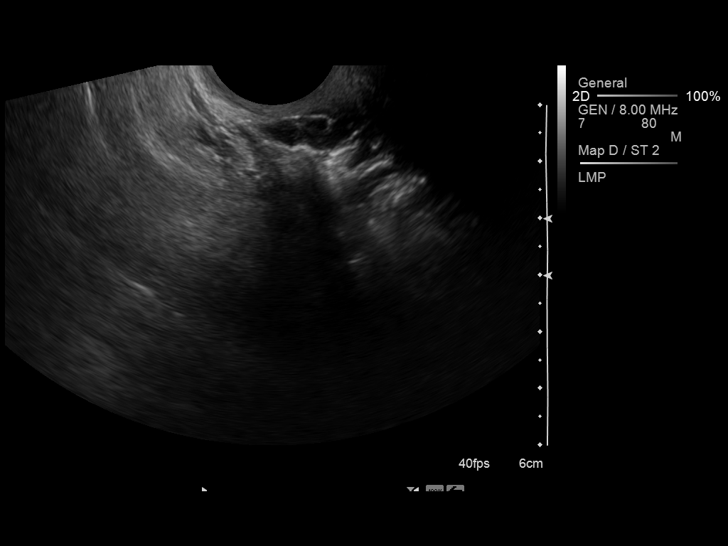
[im 69/76]
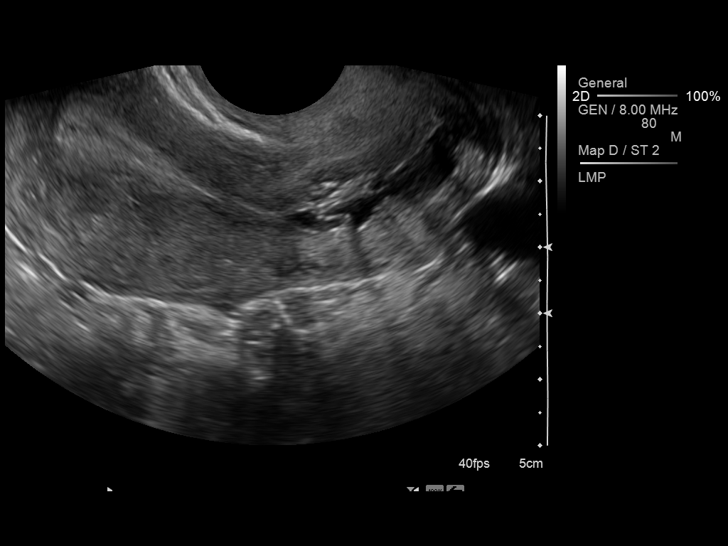
[im 76/76]
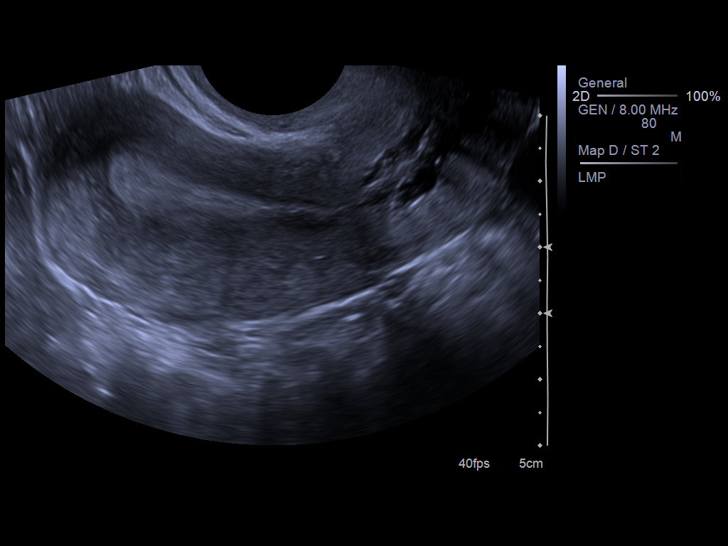

[14 of 25 positions shown; findings below may reference images not displayed]

FINDINGS: Uterus:  Measures 7.6 x 3.5 x 4.3 cm.  No myometrial abnormalities.
Multiple Nabothian cysts are noted.

Endometrium: Normal in thickness measuring a maximum of 7.4 mm.

Right ovary: Measures 3.7 x 2.7 x 2.9 cm. No cysts or masses.

Left ovary: Measures 3.8 x 3.2 x 2.7 cm.  No cysts or masses.

Pulsed Doppler evaluation demonstrates normal low-resistance
arterial and venous waveforms in both ovaries.
IMPRESSION: Normal exam.  No evidence of pelvic mass or other significant
abnormality

No sonographic evidence for ovarian torsion.

## 2014-07-16 ENCOUNTER — Encounter (HOSPITAL_COMMUNITY): Payer: Self-pay

## 2014-07-16 ENCOUNTER — Emergency Department (HOSPITAL_COMMUNITY)
Admission: EM | Admit: 2014-07-16 | Discharge: 2014-07-17 | Disposition: A | Discharge status: Home / Self Care | Payer: Self-pay | Attending: Emergency Medicine | Admitting: Emergency Medicine

## 2014-07-16 DIAGNOSIS — Z3202 Encounter for pregnancy test, result negative: Secondary | ICD-10-CM | POA: Insufficient documentation

## 2014-07-16 DIAGNOSIS — F419 Anxiety disorder, unspecified: Secondary | ICD-10-CM | POA: Diagnosis present

## 2014-07-16 DIAGNOSIS — Z8659 Personal history of other mental and behavioral disorders: Secondary | ICD-10-CM | POA: Insufficient documentation

## 2014-07-16 DIAGNOSIS — F1910 Other psychoactive substance abuse, uncomplicated: Secondary | ICD-10-CM

## 2014-07-16 DIAGNOSIS — F1310 Sedative, hypnotic or anxiolytic abuse, uncomplicated: Secondary | ICD-10-CM | POA: Insufficient documentation

## 2014-07-16 DIAGNOSIS — Z791 Long term (current) use of non-steroidal anti-inflammatories (NSAID): Secondary | ICD-10-CM | POA: Insufficient documentation

## 2014-07-16 DIAGNOSIS — Z720 Tobacco use: Secondary | ICD-10-CM | POA: Insufficient documentation

## 2014-07-16 DIAGNOSIS — F1390 Sedative, hypnotic, or anxiolytic use, unspecified, uncomplicated: Secondary | ICD-10-CM | POA: Diagnosis present

## 2014-07-16 DIAGNOSIS — F1110 Opioid abuse, uncomplicated: Secondary | ICD-10-CM | POA: Insufficient documentation

## 2014-07-16 DIAGNOSIS — Z880 Allergy status to penicillin: Secondary | ICD-10-CM | POA: Insufficient documentation

## 2014-07-16 DIAGNOSIS — F1210 Cannabis abuse, uncomplicated: Secondary | ICD-10-CM | POA: Insufficient documentation

## 2014-07-16 NOTE — ED Notes (Signed)
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

## 2014-07-16 NOTE — ED Notes (Signed)
Pt wants help with xanax, klonopin, and valium

## 2014-07-17 DIAGNOSIS — Z87898 Personal history of other specified conditions: Secondary | ICD-10-CM

## 2014-07-17 DIAGNOSIS — F419 Anxiety disorder, unspecified: Secondary | ICD-10-CM | POA: Diagnosis present

## 2014-07-17 DIAGNOSIS — Z8659 Personal history of other mental and behavioral disorders: Secondary | ICD-10-CM

## 2014-07-17 DIAGNOSIS — F1310 Sedative, hypnotic or anxiolytic abuse, uncomplicated: Secondary | ICD-10-CM

## 2014-07-17 DIAGNOSIS — F1390 Sedative, hypnotic, or anxiolytic use, unspecified, uncomplicated: Secondary | ICD-10-CM | POA: Diagnosis present

## 2014-07-17 LAB — CBC WITH DIFFERENTIAL/PLATELET
BASOS ABS: 0 10*3/uL (ref 0.0–0.1)
BASOS PCT: 0 % (ref 0–1)
EOS PCT: 2 % (ref 0–5)
Eosinophils Absolute: 0.2 10*3/uL (ref 0.0–0.7)
HEMATOCRIT: 38.9 % (ref 36.0–46.0)
Hemoglobin: 12.5 g/dL (ref 12.0–15.0)
Lymphocytes Relative: 23 % (ref 12–46)
Lymphs Abs: 2.2 10*3/uL (ref 0.7–4.0)
MCH: 28.2 pg (ref 26.0–34.0)
MCHC: 32.1 g/dL (ref 30.0–36.0)
MCV: 87.6 fL (ref 78.0–100.0)
MONO ABS: 0.8 10*3/uL (ref 0.1–1.0)
Monocytes Relative: 8 % (ref 3–12)
Neutro Abs: 6.5 10*3/uL (ref 1.7–7.7)
Neutrophils Relative %: 67 % (ref 43–77)
PLATELETS: 201 10*3/uL (ref 150–400)
RBC: 4.44 MIL/uL (ref 3.87–5.11)
RDW: 14.6 % (ref 11.5–15.5)
WBC: 9.7 10*3/uL (ref 4.0–10.5)

## 2014-07-17 LAB — I-STAT CHEM 8, ED
BUN: 8 mg/dL (ref 6–23)
CHLORIDE: 105 mmol/L (ref 96–112)
Calcium, Ion: 1.17 mmol/L (ref 1.12–1.23)
Creatinine, Ser: 0.7 mg/dL (ref 0.50–1.10)
GLUCOSE: 68 mg/dL — AB (ref 70–99)
HEMATOCRIT: 41 % (ref 36.0–46.0)
HEMOGLOBIN: 13.9 g/dL (ref 12.0–15.0)
POTASSIUM: 3.5 mmol/L (ref 3.5–5.1)
Sodium: 144 mmol/L (ref 135–145)
TCO2: 22 mmol/L (ref 0–100)

## 2014-07-17 LAB — ETHANOL: Alcohol, Ethyl (B): <5 mg/dL (ref 0–9)

## 2014-07-17 LAB — RAPID URINE DRUG SCREEN, HOSP PERFORMED
Amphetamines: NOT DETECTED
BARBITURATES: NOT DETECTED
BENZODIAZEPINES: POSITIVE — AB
COCAINE: NOT DETECTED
Opiates: POSITIVE — AB
TETRAHYDROCANNABINOL: POSITIVE — AB

## 2014-07-17 LAB — PREGNANCY, URINE: PREG TEST UR: NEGATIVE

## 2014-07-17 MED ORDER — ACETAMINOPHEN 325 MG PO TABS: 650.0000 mg | ORAL_TABLET | ORAL | Status: DC | PRN

## 2014-07-17 MED ORDER — TRAZODONE HCL 100 MG PO TABS: 100.0000 mg | ORAL_TABLET | Freq: Every evening | ORAL | Status: DC | PRN

## 2014-07-17 MED ORDER — LORAZEPAM 1 MG PO TABS
1.0000 mg | ORAL_TABLET | Freq: Four times a day (QID) | ORAL | Status: DC | PRN
  Administered 2014-07-17: 1 mg via ORAL
  Filled 2014-07-17: qty 1

## 2014-07-17 MED ORDER — ONDANSETRON HCL 4 MG PO TABS: 4.0000 mg | ORAL_TABLET | Freq: Three times a day (TID) | ORAL | Status: DC | PRN

## 2014-07-17 MED ORDER — IBUPROFEN 200 MG PO TABS: 600.0000 mg | ORAL_TABLET | Freq: Three times a day (TID) | ORAL | Status: DC | PRN

## 2014-07-17 MED ORDER — ACETAMINOPHEN 325 MG PO TABS
650.0000 mg | ORAL_TABLET | Freq: Once | ORAL | Status: AC
  Administered 2014-07-17: 650 mg via ORAL
  Filled 2014-07-17: qty 2

## 2014-07-17 MED ORDER — CITALOPRAM HYDROBROMIDE 10 MG PO TABS
10.0000 mg | ORAL_TABLET | Freq: Every day | ORAL | Status: DC
  Administered 2014-07-17: 10 mg via ORAL
  Filled 2014-07-17: qty 1

## 2014-07-17 MED ORDER — CLONIDINE HCL 0.1 MG PO TABS
0.1000 mg | ORAL_TABLET | Freq: Three times a day (TID) | ORAL | Status: DC | PRN
  Administered 2014-07-17: 0.1 mg via ORAL
  Filled 2014-07-17: qty 1

## 2014-07-17 NOTE — ED Notes (Signed)
Bed: [id number] Expected date:  Expected time:  Means of arrival:  Comments: No bed
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
## 2014-07-17 NOTE — ED Notes (Signed)
MD at bedside.  Mom at bedside, pt requesting to go home

## 2014-07-17 NOTE — ED Notes (Signed)
Pt changing into scrubs, will then drawn blood and call security to wand pt.

## 2014-07-17 NOTE — ED Provider Notes (Signed)
CSN: [id number]     Arrival date & time 07/16/14  2237 History   First MD Initiated Contact with Patient 07/17/14 0006     Chief Complaint  Patient presents with  . Drug Problem     (Consider location/radiation/quality/duration/timing/severity/associated sxs/prior Treatment) HPI Comments: Patient in the emergency department tonight requesting help with benzo abuse states she was started on benzos for her anxiety and when she lost her insurance.  She started buying and on the street and abusing it.  She also goes to the methadone clinic since September for addiction to opiates.  She has not discussed detox from benzos with her counselor.  She is not suicidal or homicidal  Patient is a 24 y.o. female presenting with drug problem. The history is provided by the patient.  Drug Problem This is a chronic problem. The problem occurs constantly. The problem has been unchanged. Pertinent negatives include no abdominal pain, chills, fever or nausea. Nothing aggravates the symptoms. She has tried nothing for the symptoms. The treatment provided no relief.    Past Medical History  Diagnosis Date  . DDD (degenerative disc disease)   . Anxiety   . Panic attacks   . Depression    Past Surgical History  Procedure Laterality Date  . Tonsillectomy    . Kidney surgery    . Wisdom tooth extraction     History reviewed. No pertinent family history. History  Substance Use Topics  . Smoking status: Current Every Day Smoker -- 1.00 packs/day  . Smokeless tobacco: Not on file  . Alcohol Use: No   OB History    No data available     Review of Systems  Constitutional: Negative for fever and chills.  Gastrointestinal: Negative for nausea and abdominal pain.  Neurological: Negative for dizziness.  All other systems reviewed and are negative.     Allergies  Sulfa antibiotics and Amoxicillin  Home Medications   Prior to Admission medications   Medication Sig Start Date End Date Taking?  Authorizing Provider  acetaminophen (TYLENOL) 325 MG tablet Take 650 mg by mouth every 6 (six) hours as needed for moderate pain (tooth pain).   Yes Historical Provider, MD  buprenorphine (SUBUTEX) 8 MG SUBL SL tablet Place 16 mg under the tongue daily.   Yes Historical Provider, MD  naproxen sodium (ANAPROX) 220 MG tablet Take 440 mg by mouth daily as needed (tooth pain).   Yes Historical Provider, MD  clindamycin (CLEOCIN) 150 MG capsule Take 2 capsules (300 mg total) by mouth 3 (three) times daily. Patient not taking: Reported on 07/16/2014 08/08/13   Melvenia Beam A Upstill, PA-C  HYDROcodone-acetaminophen (NORCO/VICODIN) 5-325 MG per tablet Take 1-2 tablets by mouth every 4 (four) hours as needed. Patient not taking: Reported on 07/16/2014 08/08/13   Melvenia Beam A Upstill, PA-C   BP 118/69 mmHg  Pulse 87  Temp(Src) 98.1 F (36.7 C) (Oral)  Resp 16  SpO2 99%  LMP 06/08/2014 Physical Exam  Constitutional: She appears well-developed and well-nourished.  HENT:  Head: Normocephalic.  Eyes: Pupils are equal, round, and reactive to light.  Neck: Normal range of motion.  Cardiovascular: Normal rate.   Pulmonary/Chest: Effort normal.  Musculoskeletal: Normal range of motion.  Neurological: She is alert.  Skin: Skin is warm and dry.  Psychiatric: Her speech is normal and behavior is normal. Judgment and thought content normal. Her mood appears anxious. Cognition and memory are normal.  Nursing note and vitals reviewed.   ED Course  Procedures (including critical care  time) Labs Review Labs Reviewed  URINE RAPID DRUG SCREEN (HOSP PERFORMED) - Abnormal; Notable for the following:    Opiates POSITIVE (*)    Benzodiazepines POSITIVE (*)    Tetrahydrocannabinol POSITIVE (*)    All other components within normal limits  I-STAT CHEM 8, ED - Abnormal; Notable for the following:    Glucose, Bld 68 (*)    All other components within normal limits  CBC WITH DIFFERENTIAL/PLATELET  ETHANOL  PREGNANCY, URINE     Imaging Review No results found.   EKG Interpretation None     patient has been assessed by [facility] he meets admission criteria.  They're actively seeking bed placement for her.  No seizure that is available.  They will give her referrals for outpatient therapy  MDM   Final diagnoses:  None        Arman FilterGail K Gwendolynn Merkey, NP 07/17/14 [id number]  Raeford RazorStephen Kohut, MD 07/19/14 1009

## 2014-07-17 NOTE — BH Assessment (Addendum)
[facility] has reviewed referral packet. They cannot accept pt is she is unwilling to come of her maintenance drugs. Will check with pt and call [facility] back.   Spoke with pt who reports she does not wish to stop maintenance drug as she wants to stop one at a time, she feels she will be more successful.   Called Patsy at [facility] to inform.  Clista BernhardtNancy Britanni Yarde, [facility] Triage Specialist 07/17/2014 3:58 AM

## 2014-07-17 NOTE — ED Notes (Signed)
TTS at bedside. 

## 2014-07-17 NOTE — ED Notes (Signed)
TTS completed, will let RN know if pt meets admission criteria. Pt given ginger ale per request at this time.

## 2014-07-17 NOTE — BH Assessment (Signed)
Writer spoke w/ EDP Pfeiffer who will remove [facility]TS consult as pt has been rounded on by Dr Jannifer FranklinAkintayo and Catalina AntiguaJosephone Ohuoha NP this am.  Evette Cristalaroline Paige Kellar Westberg, [place] Assessment Counselor

## 2014-07-17 NOTE — BHH Suicide Risk Assessment (Cosign Needed)
Suicide Risk Assessment  Discharge Assessment   [facility]   Demographic Factors:  Adolescent or young adult, Caucasian and Low socioeconomic status  Total Time spent with patient: 20 minutes  Musculoskeletal: Strength & Muscle Tone: within normal limits Gait & Station: normal Patient leans: N/A  Psychiatric Specialty Exam:     Blood pressure 110/62, pulse 66, temperature 98.4 F (36.9 C), temperature source Oral, resp. rate 16, last menstrual period 06/08/2014, SpO2 99 %.There is no weight on file to calculate BMI.  General Appearance: Casual  Eye Contact::  Good  Speech:  Clear and Coherent and Normal Rate409  Volume:  Normal  Mood:  Anxious and Depressed  Affect:  Congruent, Depressed and Flat  Thought Process:  Coherent, Goal Directed and Intact  Orientation:  Full (Time, Place, and Person)  Thought Content:  WDL  Suicidal Thoughts:  No  Homicidal Thoughts:  No  Memory:  Immediate;   Good Recent;   Good Remote;   Good  Judgement:  Good  Insight:  Good  Psychomotor Activity:  Normal  Concentration:  Good  Recall:  NA  Fund of Knowledge:Good  Language: Good  Akathisia:  NA  Handed:  Right  AIMS (if indicated):     Assets:  Desire for Improvement  Sleep:     Cognition: WNL  ADL's:  Intact      Has this patient used any form of tobacco in the last 30 days? (Cigarettes, Smokeless Tobacco, Cigars, and/or Pipes) N/A  Mental Status Per Nursing Assessment::   On Admission:     Current Mental Status by Physician: NA  Loss Factors: Financial problems/change in socioeconomic status  Historical Factors: Family history of mental illness or substance abuse  Risk Reduction Factors:   Employed and Living with another person, especially a relative  Continued Clinical Symptoms:  Bipolar Disorder:   Depressive phase Alcohol/Substance Abuse/Dependencies  Cognitive Features That Contribute To Risk:  Closed-mindedness and Polarized  thinking    Suicide Risk:  Minimal: No identifiable suicidal ideation.  Patients presenting with no risk factors but with morbid ruminations; may be classified as minimal risk based on the severity of the depressive symptoms  Principal Problem: Benzodiazepine misuse Discharge Diagnoses:  Patient Active Problem List   Diagnosis Date Noted  . 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 Anxiety disorder [F41.9] 07/17/2014      Plan Of Care/Follow-up recommendations:  Activity:  AS TOLERATED Diet:  REGULAR  Is patient on multiple antipsychotic therapies at discharge:  No   Has Patient had three or more failed trials of antipsychotic monotherapy by history:  No  Recommended Plan for Multiple Antipsychotic Therapies: NA    Jevin Camino, C   PMHNP-BC 07/17/2014, 2:11 PM

## 2014-07-17 NOTE — ED Notes (Signed)
Patient's mother here for visit. Wanded by security and advised about policy and belongings.

## 2014-07-17 NOTE — BH Assessment (Signed)
Donell SievertSpencer Simon, PA [facility]. If pt not accepted to look at possible discharge with OP resources. Sent referrals.   Clista BernhardtNancy Vihan Santagata, [facility] Triage Specialist 07/17/2014 3:01 AM

## 2014-07-17 NOTE — BH Assessment (Signed)
Reviewed EDP note prior to initiating assessment. Pt requesting help with benzo abuse. 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 No SI or HI reported.    Assessment to commence shortly.    Clista BernhardtNancy Fatim Vanderschaaf, [facility] Triage Specialist 07/17/2014 12:40 AM
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
## 2014-07-17 NOTE — BH Assessment (Addendum)
[facility]. She asks that Clinical research associatewriter refax referral. Clinical research associateWriter then faxed referral to [facility]  Evette Cristalaroline Paige Yeilin Zweber, LCSWA Assessment Counselor   Writer left [facility]ic[facility] for [facility]issa [phone] re: pt's referral.  Evette Cristalaroline Paige Reem Fleury, LCSWA Assessment Counselor

## 2014-07-17 NOTE — BHH Counselor (Signed)
Per RN Rachel's request, Clinical research associatewriter spoke w/ pt and pt's mom who is at bedside. Writer answered family's questions re: obtaining suboxone or methadone in the ED and questions re: pt's possible disposition. Writer explained that [facility] providers don't prescribe suboxone or methadone as those meds must only be dispensed under a special license. Writer explained that [facility] had declined pt but [facility]. Writer also explained that [facility].  Evette Cristalaroline Paige Philippe Gang, [place] Assessment Counselor
